# Patient Record
Sex: Male | Born: 1995 | Race: White | Hispanic: No | Marital: Single | Smoking: Never smoker
Health system: Southern US, Community
[De-identification: ages and names within clinical notes are randomized; demographics above are authoritative.]

## PROBLEM LIST (undated history)

## (undated) DIAGNOSIS — I10 Essential (primary) hypertension: Secondary | ICD-10-CM

---
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
## 2017-08-15 ENCOUNTER — Encounter (HOSPITAL_BASED_OUTPATIENT_CLINIC_OR_DEPARTMENT_OTHER): Payer: Self-pay

## 2017-08-15 ENCOUNTER — Emergency Department (HOSPITAL_BASED_OUTPATIENT_CLINIC_OR_DEPARTMENT_OTHER)
Admission: EM | Admit: 2017-08-15 | Discharge: 2017-08-15 | Disposition: A | Discharge status: Home / Self Care | Payer: BLUE CROSS/BLUE SHIELD | Attending: Emergency Medicine | Admitting: Emergency Medicine

## 2017-08-15 ENCOUNTER — Emergency Department (HOSPITAL_BASED_OUTPATIENT_CLINIC_OR_DEPARTMENT_OTHER): Payer: BLUE CROSS/BLUE SHIELD

## 2017-08-15 DIAGNOSIS — R002 Palpitations: Secondary | ICD-10-CM | POA: Diagnosis not present

## 2017-08-15 DIAGNOSIS — F419 Anxiety disorder, unspecified: Secondary | ICD-10-CM | POA: Insufficient documentation

## 2017-08-15 DIAGNOSIS — R079 Chest pain, unspecified: Secondary | ICD-10-CM | POA: Diagnosis present

## 2017-08-15 DIAGNOSIS — I10 Essential (primary) hypertension: Secondary | ICD-10-CM | POA: Diagnosis not present

## 2017-08-15 HISTORY — DX: Essential (primary) hypertension: I10

## 2017-08-15 LAB — BASIC METABOLIC PANEL
Anion gap: 8 (ref 5–15)
BUN: 23 mg/dL — AB (ref 6–20)
CO2: 27 mmol/L (ref 22–32)
CREATININE: 0.94 mg/dL (ref 0.61–1.24)
Calcium: 9.2 mg/dL (ref 8.9–10.3)
Chloride: 101 mmol/L (ref 101–111)
Glucose, Bld: 126 mg/dL — ABNORMAL HIGH (ref 65–99)
POTASSIUM: 3.3 mmol/L — AB (ref 3.5–5.1)
SODIUM: 136 mmol/L (ref 135–145)

## 2017-08-15 LAB — CBC WITH DIFFERENTIAL/PLATELET
BASOS PCT: 0 %
Basophils Absolute: 0 10*3/uL (ref 0.0–0.1)
EOS ABS: 0.1 10*3/uL (ref 0.0–0.7)
EOS PCT: 2 %
HCT: 43.1 % (ref 39.0–52.0)
Hemoglobin: 14.5 g/dL (ref 13.0–17.0)
LYMPHS ABS: 2.5 10*3/uL (ref 0.7–4.0)
Lymphocytes Relative: 35 %
MCH: 29.3 pg (ref 26.0–34.0)
MCHC: 33.6 g/dL (ref 30.0–36.0)
MCV: 87.1 fL (ref 78.0–100.0)
Monocytes Absolute: 0.6 10*3/uL (ref 0.1–1.0)
Monocytes Relative: 9 %
NEUTROS PCT: 54 %
Neutro Abs: 4 10*3/uL (ref 1.7–7.7)
PLATELETS: 227 10*3/uL (ref 150–400)
RBC: 4.95 MIL/uL (ref 4.22–5.81)
RDW: 13.2 % (ref 11.5–15.5)
WBC: 7.3 10*3/uL (ref 4.0–10.5)

## 2017-08-15 LAB — D-DIMER, QUANTITATIVE (NOT AT ARMC)

## 2017-08-15 LAB — TROPONIN I

## 2017-08-15 MED ORDER — LORAZEPAM 2 MG/ML IJ SOLN
1.0000 mg | Freq: Once | INTRAMUSCULAR | Status: AC
  Administered 2017-08-15: 1 mg via INTRAVENOUS
  Filled 2017-08-15: qty 1

## 2017-08-15 MED ORDER — LORAZEPAM 1 MG PO TABS: 1.0000 mg | ORAL_TABLET | Freq: Three times a day (TID) | ORAL | 0 refills | Status: AC | PRN

## 2017-08-15 MED FILL — LORazepam 1 MG TABS: 1 | 3 days supply | Qty: 10 | Fill #0

## 2017-08-15 NOTE — ED Notes (Signed)
ED Provider at bedside. 

## 2017-08-15 NOTE — ED Triage Notes (Signed)
Pt c/o recurrent palpitations over the last several weeks, tonight he woke and they were accompanied by chest tightness.  Pt states he has been on several long car trips without getting out of the car and moving.  Pt appears anxious in triage.

## 2017-08-15 NOTE — ED Provider Notes (Signed)
TIME SEEN: 6:38 AM  CHIEF COMPLAINT: Chest pain, palpitations, shortness of breath  HPI: Patient is a 21 year old male with history of hypertension who presents to the emergency department with diffuse anterior chest tightness, palpitations and shortness of breath.  States he feels very anxious and "jittery".  Symptoms started this morning around 5 AM.  No fevers or cough.  No lower extremity swelling or pain.  [place] and states that he was in a car for approximately 10 hours straight.  No history of PE or DVT.  No family history of premature CAD.  No diabetes or hyperlipidemia.  No diaphoresis, nausea or vomiting, dizziness.  States he has had these episodes twice before.  Was evaluated by a doctor in [place] for the same as well as an urgent care at the [facility] [place].  States his workup was unremarkable.  He states he does feel anxious.  ROS: See HPI Constitutional: no fever  Eyes: no drainage  ENT: no runny nose   Cardiovascular:   chest pain  Resp:  SOB  GI: no vomiting GU: no dysuria Integumentary: no rash  Allergy: no hives  Musculoskeletal: no leg swelling  Neurological: no slurred speech ROS otherwise negative  PAST MEDICAL HISTORY/PAST SURGICAL HISTORY:  Past Medical History:  Diagnosis Date  . Hypertension     MEDICATIONS:  Prior to Admission medications   Not on File    ALLERGIES:  No Known Allergies  SOCIAL HISTORY:  Social History  Substance Use Topics  . Smoking status: Never Smoker  . Smokeless tobacco: Never Used  . Alcohol use Yes     Comment: occasional    FAMILY HISTORY: No family history on file.  EXAM: BP (!) 151/83 (BP Location: Left Arm)   Pulse 86   Temp 98.2 F (36.8 C) (Oral)   Ht 5\' 7"  (1.702 m)   Wt 90.7 kg (200 lb)   SpO2 100%   BMI 31.32 kg/m  CONSTITUTIONAL: Alert and oriented and responds appropriately to questions. Well-appearing; well-nourished HEAD: Normocephalic EYES:  Conjunctivae clear, pupils appear equal, EOMI ENT: normal nose; moist mucous membranes NECK: Supple, no meningismus, no nuchal rigidity, no LAD  CARD: RRR; S1 and S2 appreciated; no murmurs, no clicks, no rubs, no gallops RESP: Normal chest excursion without splinting or tachypnea; breath sounds clear and equal bilaterally; no wheezes, no rhonchi, no rales, no hypoxia or respiratory distress, speaking full sentences ABD/GI: Normal bowel sounds; non-distended; soft, non-tender, no rebound, no guarding, no peritoneal signs, no hepatosplenomegaly BACK:  The back appears normal and is non-tender to palpation, there is no CVA tenderness EXT: Normal ROM in all joints; non-tender to palpation; no edema; normal capillary refill; no cyanosis, no calf tenderness or swelling    SKIN: Normal color for age and race; warm; no rash NEURO: Moves all extremities equally PSYCH: Patient appears very anxious.  Grooming and personal hygiene are appropriate.  MEDICAL DECISION MAKING: Patient here with chest pain.  I suspect he is having a panic attack.  Does however have risk factors for PE.  Doubt ACS, dissection.  Will obtain labs including troponin and d-dimer, chest x-ray.  EKG shows no ischemic abnormality.  Will give Ativan.  His significant other is here and can drive him home if workup is unremarkable.  ED PROGRESS: 7:00 AM  Pt's labs, CXR pending.  Signed out to Dr. Adela LankFloyd to follow up on patient's labs and imaging.  Anticipate if workup is negative, patient can  be discharged with PCP follow up.     EKG Interpretation  Date/Time:  Wednesday August 15 2017 06:38:36 EDT Ventricular Rate:  85 PR Interval:    QRS Duration: 119 QT Interval:  365 QTC Calculation: 434 R Axis:   58 Text Interpretation:  Sinus rhythm Nonspecific intraventricular conduction delay No old tracing to compare Confirmed by Ward, Baxter Hire [phone]) on 08/15/2017 6:48:56 AM         Ward, Layla Maw, DO 08/16/17 0234

## 2017-08-15 NOTE — Discharge Instructions (Addendum)
To find a primary care or specialty doctor please call [phone] or [phone] to access "[facility] Find a Doctor Service." ° °You may also go on the [facility] website at [email or web address] ° °There are also multiple [facility], [facility] and [facility] practices throughout the [place] that are frequently accepting new patients. You may find a clinic that is close to your home and contact them. ° °[facility] -  °[address] °[place] [address] [phone] ° ° °[facility] -  °[address] °[place] [address] [phone] ° ° °[facility] - °[address]  °[place]  [address] [phone] ° ° °

## 2017-08-15 NOTE — ED Provider Notes (Signed)
21 yo M with a chief complaint of shortness of breath.  I received the patient in signout from Dr. Elesa MassedWard.  Briefly the patient had a sudden onset of shortness of breath is been going on for the past few hours.  History of the same x2 without found etiology.  He recently had a prolonged car trip.  Workup to evaluate for the possibility of PE.  EKG chest x-ray d-dimer negative. D/c home.   7:27 AM:  I have discussed the diagnosis/risks/treatment options with the patient and family and believe the pt to be eligible for discharge home to follow-up with PCP. We also discussed returning to the ED immediately if new or worsening sx occur. We discussed the sx which are most concerning (e.g., sudden worsening pain, fever, inability to tolerate by mouth) that necessitate immediate return. Medications administered to the patient during their visit and any new prescriptions provided to the patient are listed below.  Medications given during this visit Medications  LORazepam (ATIVAN) injection 1 mg (1 mg Intravenous Given 08/15/17 [id number])     The patient appears reasonably screen and/or stabilized for discharge and I doubt any other medical condition or other [facility] requiring further screening, evaluation, or treatment in the ED at this time prior to discharge.     Melene PlanFloyd, Dionne Rossa, DO 08/15/17 [phone]

## 2017-12-15 ENCOUNTER — Encounter (HOSPITAL_BASED_OUTPATIENT_CLINIC_OR_DEPARTMENT_OTHER): Payer: Self-pay | Admitting: Emergency Medicine

## 2017-12-15 ENCOUNTER — Emergency Department (HOSPITAL_BASED_OUTPATIENT_CLINIC_OR_DEPARTMENT_OTHER)
Admission: EM | Admit: 2017-12-15 | Discharge: 2017-12-15 | Disposition: A | Discharge status: Home / Self Care | Payer: BLUE CROSS/BLUE SHIELD | Attending: Emergency Medicine | Admitting: Emergency Medicine

## 2017-12-15 ENCOUNTER — Other Ambulatory Visit: Payer: Self-pay

## 2017-12-15 DIAGNOSIS — I10 Essential (primary) hypertension: Secondary | ICD-10-CM | POA: Diagnosis not present

## 2017-12-15 DIAGNOSIS — Z79899 Other long term (current) drug therapy: Secondary | ICD-10-CM | POA: Diagnosis not present

## 2017-12-15 DIAGNOSIS — A389 Scarlet fever, uncomplicated: Secondary | ICD-10-CM | POA: Diagnosis not present

## 2017-12-15 DIAGNOSIS — R21 Rash and other nonspecific skin eruption: Secondary | ICD-10-CM | POA: Diagnosis present

## 2017-12-15 LAB — CBC WITH DIFFERENTIAL/PLATELET
Basophils Absolute: 0 10*3/uL (ref 0.0–0.1)
Basophils Relative: 0 %
Eosinophils Absolute: 0.1 10*3/uL (ref 0.0–0.7)
Eosinophils Relative: 1 %
HEMATOCRIT: 43.5 % (ref 39.0–52.0)
HEMOGLOBIN: 14.7 g/dL (ref 13.0–17.0)
LYMPHS ABS: 1 10*3/uL (ref 0.7–4.0)
Lymphocytes Relative: 11 %
MCH: 29.3 pg (ref 26.0–34.0)
MCHC: 33.8 g/dL (ref 30.0–36.0)
MCV: 86.8 fL (ref 78.0–100.0)
MONO ABS: 0.7 10*3/uL (ref 0.1–1.0)
MONOS PCT: 8 %
NEUTROS ABS: 7.3 10*3/uL (ref 1.7–7.7)
NEUTROS PCT: 80 %
Platelets: 213 10*3/uL (ref 150–400)
RBC: 5.01 MIL/uL (ref 4.22–5.81)
RDW: 13.1 % (ref 11.5–15.5)
WBC: 9.1 10*3/uL (ref 4.0–10.5)

## 2017-12-15 LAB — BASIC METABOLIC PANEL
Anion gap: 8 (ref 5–15)
BUN: 15 mg/dL (ref 6–20)
CHLORIDE: 99 mmol/L — AB (ref 101–111)
CO2: 30 mmol/L (ref 22–32)
CREATININE: 0.86 mg/dL (ref 0.61–1.24)
Calcium: 8.7 mg/dL — ABNORMAL LOW (ref 8.9–10.3)
GFR calc Af Amer: >60 mL/min (ref >60)
GFR calc non Af Amer: >60 mL/min (ref >60)
GLUCOSE: 113 mg/dL — AB (ref 65–99)
Potassium: 3.1 mmol/L — ABNORMAL LOW (ref 3.5–5.1)
Sodium: 137 mmol/L (ref 135–145)

## 2017-12-15 MED ORDER — CEFTRIAXONE SODIUM 2 G IJ SOLR
2.0000 g | Freq: Once | INTRAMUSCULAR | Status: AC
  Administered 2017-12-15: 2 g via INTRAVENOUS
  Filled 2017-12-15: qty 20

## 2017-12-15 MED ORDER — PREDNISONE 20 MG PO TABS: 20.0000 mg | ORAL_TABLET | Freq: Two times a day (BID) | ORAL | 0 refills | Status: AC

## 2017-12-15 MED ORDER — ACETAMINOPHEN 325 MG PO TABS
650.0000 mg | ORAL_TABLET | Freq: Once | ORAL | Status: AC | PRN
  Administered 2017-12-15: 650 mg via ORAL
  Filled 2017-12-15: qty 2

## 2017-12-15 MED ORDER — AMOXICILLIN 500 MG PO CAPS: 500.0000 mg | ORAL_CAPSULE | Freq: Three times a day (TID) | ORAL | 0 refills | Status: AC

## 2017-12-15 MED ORDER — DEXAMETHASONE SODIUM PHOSPHATE 10 MG/ML IJ SOLN
10.0000 mg | Freq: Once | INTRAMUSCULAR | Status: AC
  Administered 2017-12-15: 10 mg via INTRAVENOUS
  Filled 2017-12-15: qty 1

## 2017-12-15 NOTE — ED Notes (Signed)
Pt states he tried a new body wash this morning and went to bed. Woke up with face reddened. No itching, but had hiccups, face "burning" and fever since then. Eyes are also slightly swollen. Also c/o some mild nausea. Denies [facility] or other s/s.

## 2017-12-15 NOTE — ED Triage Notes (Addendum)
Pt reports redness and burning to his face since 6 this morning. Pt was staying with a friend and used all new products in the shower today. Pt noted to have low grade fever, no other symptoms.

## 2017-12-15 NOTE — ED Provider Notes (Signed)
[facility] EMERGENCY DEPARTMENT Provider Note   CSN: [id number] Arrival date & time: 12/15/17  1826     History   Chief Complaint Chief Complaint  Patient presents with  . Rash    HPI Mark George is a 22 y.o. male.  If complaint is rash to face and head  HPI: 22 year old male.  He is in town for a wedding.  He stayed with a friend last night.  He awakened this morning and his face was burning.  He noticed this after he got out of the shower.  He does not know if he had redness or rash of his face prior to showering.  He did use different body products but not specifically on his face, i.e. he use them everywhere.  He noticed that his face was red and burning.  He has a headache.  No sore throat.  No lightheadedness or dizziness.  He presents here for evaluation.  Past Medical History:  Diagnosis Date  . Hypertension     There are no active problems to display for this patient.   Past Surgical History:  Procedure Laterality Date  . WISDOM TOOTH EXTRACTION  2016       Home Medications    Prior to Admission medications   Medication Sig Start Date End Date Taking? Authorizing Provider  losartan-hydrochlorothiazide (HYZAAR) 100-12.5 MG tablet Take 1 tablet by mouth daily.   Yes [provider]  amoxicillin (AMOXIL) 500 MG capsule Take 1 capsule (500 mg total) by mouth 3 (three) times daily. 12/15/17   Rolland Porter, MD  LORazepam (ATIVAN) 1 MG tablet Take 1 tablet (1 mg total) by mouth 3 (three) times daily as needed for anxiety. 08/15/17   Ward, Layla Maw, DO  predniSONE (DELTASONE) 20 MG tablet Take 1 tablet (20 mg total) by mouth 2 (two) times daily with a meal. 12/15/17   Rolland Porter, MD    Family History No family history on file.  Social History Social History   Tobacco Use  . Smoking status: Never Smoker  . Smokeless tobacco: Never Used  Substance Use Topics  . Alcohol use: Yes    Comment: occasional  . Drug use: Not on file      Allergies   Patient has no known allergies.   Review of Systems Review of Systems  Constitutional: Positive for fever. Negative for appetite change, chills, diaphoresis and fatigue.  HENT: Negative for mouth sores, sore throat and trouble swallowing.        Facial rash and erythema.  Eyes: Negative for visual disturbance.  Respiratory: Negative for cough, chest tightness, shortness of breath and wheezing.   Cardiovascular: Negative for chest pain.  Gastrointestinal: Negative for abdominal distention, abdominal pain, diarrhea, nausea and vomiting.  Endocrine: Negative for polydipsia, polyphagia and polyuria.  Genitourinary: Negative for dysuria, frequency and hematuria.  Musculoskeletal: Negative for gait problem.  Skin: Negative for color change, pallor and rash.  Neurological: Negative for dizziness, syncope, light-headedness and headaches.  Hematological: Does not bruise/bleed easily.  Psychiatric/Behavioral: Negative for behavioral problems and confusion.     Physical Exam Updated Vital Signs BP (!) 118/57   Pulse 98   Temp (!) 100.6 F (38.1 C) (Oral)   Resp 18   Ht 5\' 7"  (1.702 m)   Wt 86.2 kg (190 lb)   SpO2 96%   BMI 29.76 kg/m   Physical Exam  Constitutional: He is oriented to person, place, and time. He appears well-developed and well-nourished. No distress.  HENT:  Head: Normocephalic.  Erythema assumed the majority of the head and face.  This is edematous and macular.  Not petechial.  There is elevation of the skin consistent with probable scarlatina.  Pharynx normal.  No adenopathy in the neck.  He does not have subconjunctival hemorrhage.  Remainder the skin exam is normal.  Eyes: Conjunctivae are normal. Pupils are equal, round, and reactive to light. No scleral icterus.  Neck: Normal range of motion. Neck supple. No thyromegaly present.  Cardiovascular: Normal rate and regular rhythm. Exam reveals no gallop and no friction rub.  No murmur  heard. Pulmonary/Chest: Effort normal and breath sounds normal. No respiratory distress. He has no wheezes. He has no rales.  Abdominal: Soft. Bowel sounds are normal. He exhibits no distension. There is no tenderness. There is no rebound.  Musculoskeletal: Normal range of motion.  Neurological: He is alert and oriented to person, place, and time.  Skin: Skin is warm and dry. No rash noted.  Psychiatric: He has a normal mood and affect. His behavior is normal.     ED Treatments / Results  Labs (all labs ordered are listed, but only abnormal results are displayed) Labs Reviewed  BASIC METABOLIC PANEL - Abnormal; Notable for the following components:      Result Value   Potassium 3.1 (*)    Chloride 99 (*)    Glucose, Bld 113 (*)    Calcium 8.7 (*)    All other components within normal limits  CBC WITH DIFFERENTIAL/PLATELET    EKG  EKG Interpretation None       Radiology No results found.  Procedures Procedures (including critical care time)  Medications Ordered in ED Medications  acetaminophen (TYLENOL) tablet 650 mg (650 mg Oral Given 12/15/17 1847)  dexamethasone (DECADRON) injection 10 mg (10 mg Intravenous Given 12/15/17 2015)  cefTRIAXone (ROCEPHIN) 2 g in sodium chloride 0.9 % 100 mL IVPB (2 g Intravenous New Bag/Given 12/15/17 2016)     Initial Impression / Assessment and Plan / ED Course  I have reviewed the triage vital signs and the nursing notes.  Pertinent labs & imaging results that were available during my care of the patient were reviewed by me and considered in my medical decision making (see chart for details).    He states his face feels like it is "burning" he has temp of 101.  Likely scarlatina.  Was given IV Rocephin, as well as some Solu-Medrol to help with his sensation of his burning feeling the rash did not improve or resolve.  His labs are reassuring.  He appears well.  I think this is acceptable for outpatient treatment with amoxicillin which  would be treatment of choice for group A strep.  48 hours of prednisone as he is developing some slight soft tissue prominence of his infraorbital region.  Hopefully the prednisone will limit soft tissue swelling.  Does not appear to be localized cellulitis.  I discussed with him at length the etiology, course, and treatment of scarlatina.  He will recheck here with any new or worsening symptoms.  Final Clinical Impressions(s) / ED Diagnoses   Final diagnoses:  Scarlatina  Scarlet fever    ED Discharge Orders        Ordered    predniSONE (DELTASONE) 20 MG tablet  2 times daily with meals     12/15/17 2101    amoxicillin (AMOXIL) 500 MG capsule  3 times daily     12/15/17 2101       Fayrene FearingJames,  Loraine Leriche, MD 12/15/17 2105

## 2017-12-15 NOTE — Discharge Instructions (Signed)
Your rash and fever are from a strep infection.  This is called scarlatina, or scarlet fever.  You do not have to have strep throat to have scarlet fever rash/infection.  Amoxicillin is the treatment of choice for scarlet fever/strep.  Prednisone prescription is to limit redness, swelling, and symptoms until your rash improves.  You may develop a rash on other areas of your body.  As long as you feel well and are not lightheaded, dizzy, painful, or have any worsening symptoms you can simply continue your treatment at home.

## 2019-01-24 IMAGING — DX DG CHEST 2V
2 series · 2 of 2 positions shown · non-contrast
Comparison: None.

CLINICAL DATA: Chest tightness with cardiac palpitations

EXAM:
CHEST  2 VIEW

[chest pa]
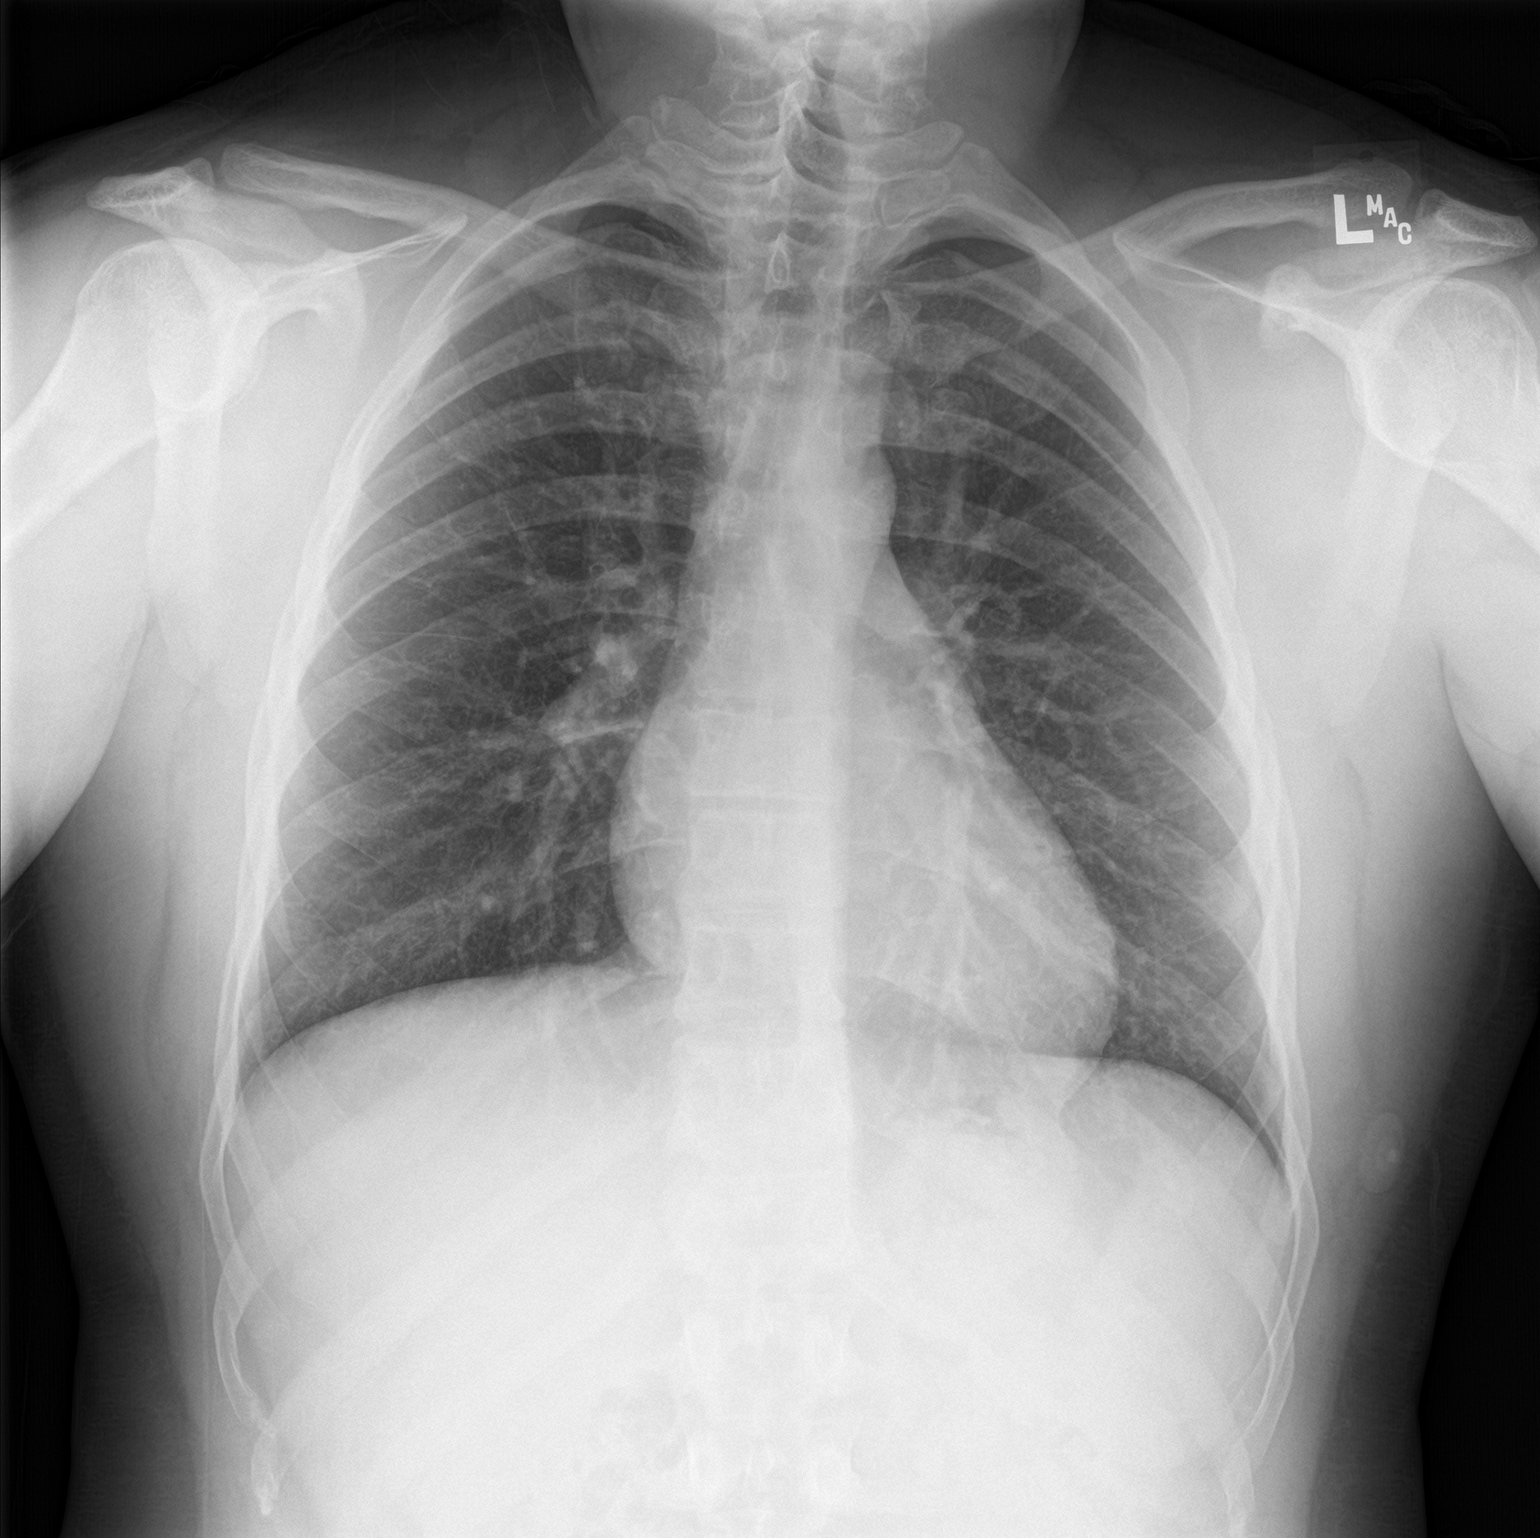

[chest lat]
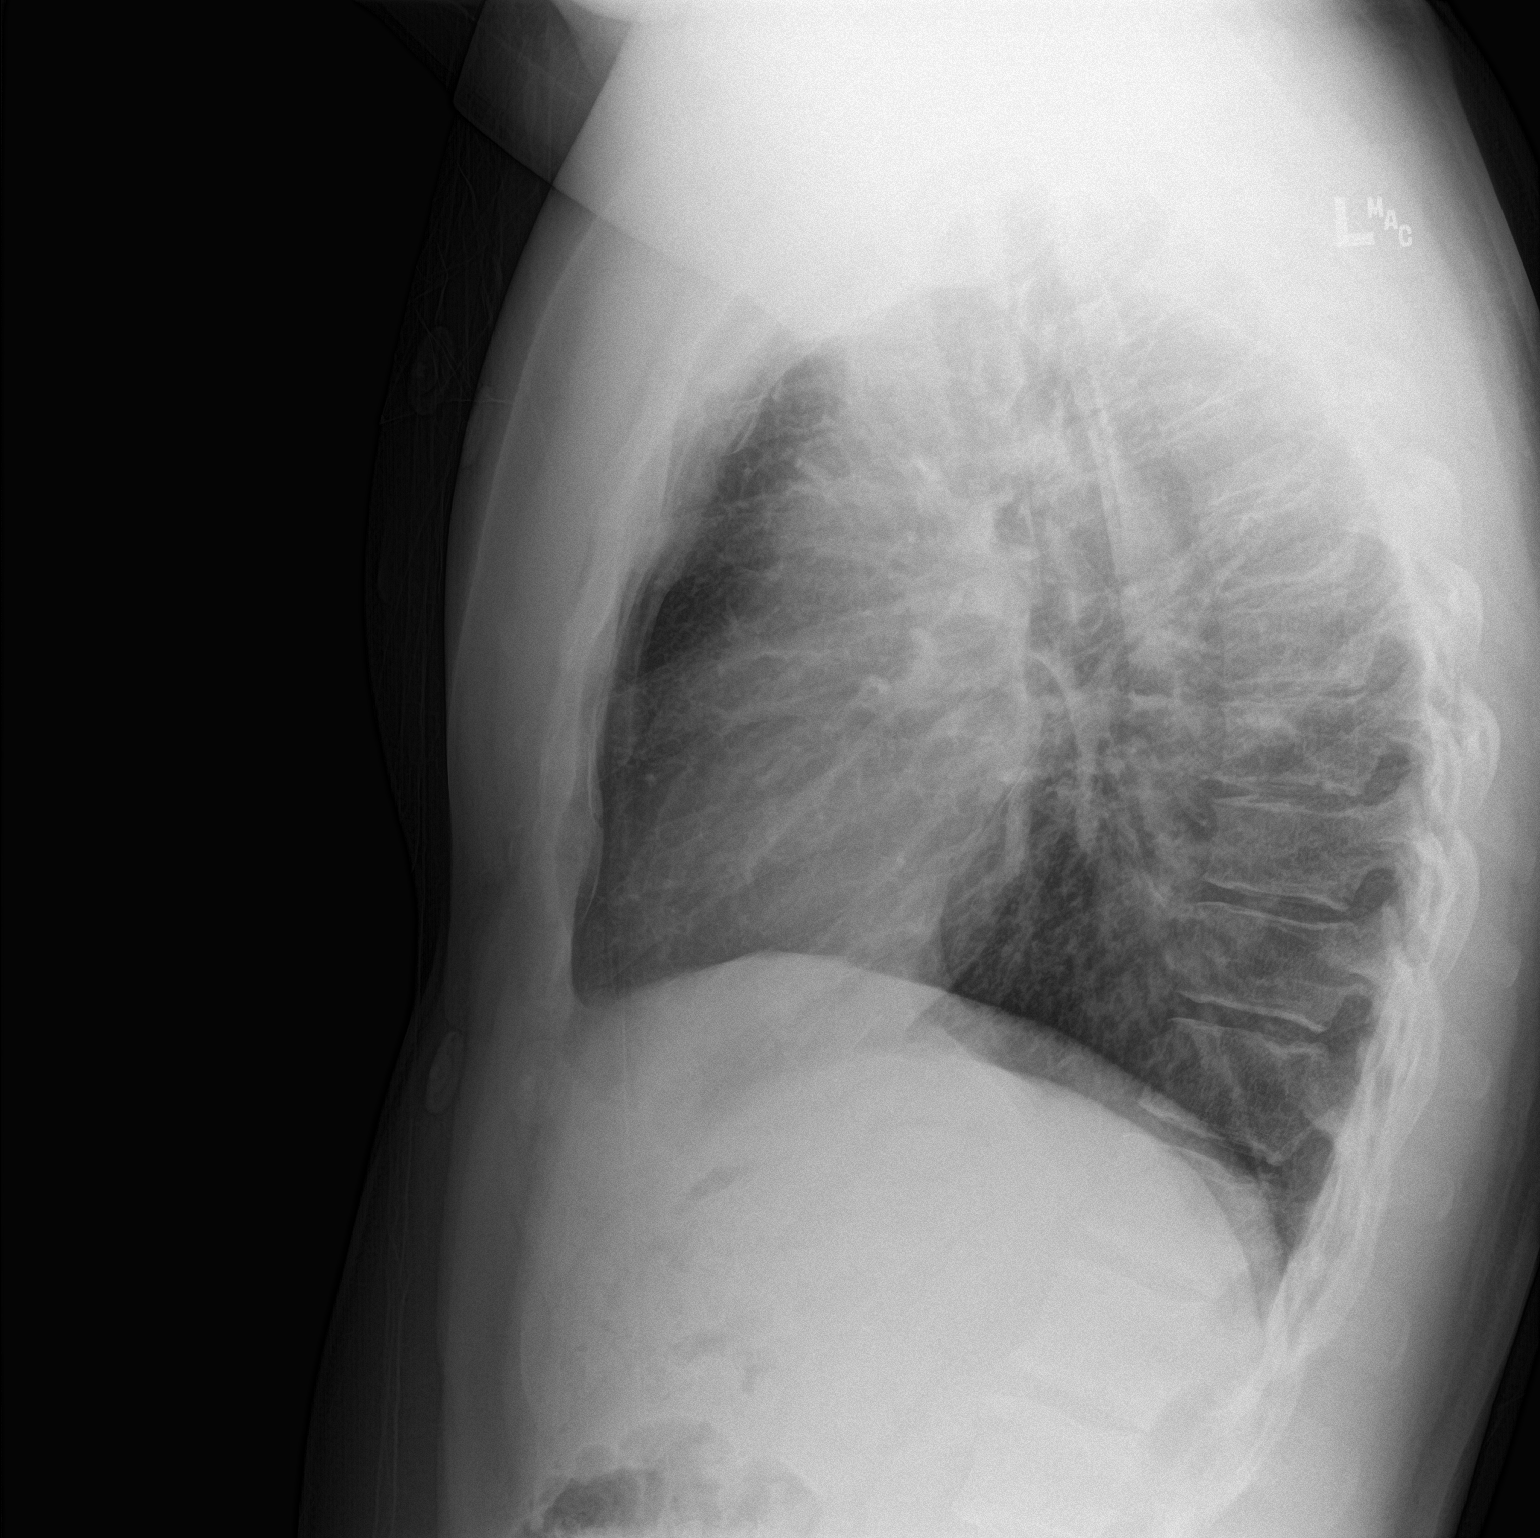

[2 of 2 positions shown; findings below may reference images not displayed]

FINDINGS: Lungs are clear. Heart size and pulmonary vascularity are normal. No
adenopathy. No pneumothorax. No evident bone lesions.
IMPRESSION: No edema or consolidation.
